# Patient Record
Sex: Male | Born: 1985 | Race: Black or African American | Hispanic: No | Marital: Single | Smoking: Never smoker
Health system: Southern US, Community
[De-identification: ages and names within clinical notes are randomized; demographics above are authoritative.]

---

## 2012-03-23 ENCOUNTER — Emergency Department (HOSPITAL_COMMUNITY)
Admission: EM | Admit: 2012-03-23 | Discharge: 2012-03-23 | Disposition: A | Discharge status: Home / Self Care | Payer: Self-pay | Attending: Emergency Medicine | Admitting: Emergency Medicine

## 2012-03-23 ENCOUNTER — Encounter (HOSPITAL_COMMUNITY): Payer: Self-pay | Admitting: *Deleted

## 2012-03-23 DIAGNOSIS — G510 Bell's palsy: Secondary | ICD-10-CM

## 2012-03-23 DIAGNOSIS — R209 Unspecified disturbances of skin sensation: Secondary | ICD-10-CM | POA: Insufficient documentation

## 2012-03-23 DIAGNOSIS — F1210 Cannabis abuse, uncomplicated: Secondary | ICD-10-CM | POA: Insufficient documentation

## 2012-03-23 DIAGNOSIS — H538 Other visual disturbances: Secondary | ICD-10-CM | POA: Insufficient documentation

## 2012-03-23 MED ORDER — PREDNISONE 10 MG PO TABS: 60.0000 mg | ORAL_TABLET | Freq: Every day | ORAL | Status: AC

## 2012-03-23 MED ORDER — PREDNISONE 20 MG PO TABS
60.0000 mg | ORAL_TABLET | Freq: Once | ORAL | Status: AC
  Administered 2012-03-23: 60 mg via ORAL
  Filled 2012-03-23: qty 3

## 2012-03-23 MED ORDER — TEARS RENEWED OP SOLN: 1.0000 [drp] | OPHTHALMIC | Status: AC

## 2012-03-23 MED ORDER — ACYCLOVIR 400 MG PO TABS: 400.0000 mg | ORAL_TABLET | Freq: Four times a day (QID) | ORAL | Status: AC

## 2012-03-23 NOTE — ED Notes (Signed)
Pt denies any difficulty swallowing or eating.

## 2012-03-23 NOTE — ED Notes (Signed)
Pt states since last Friday has had L side facial swelling and numbness, smile unsymmetrical and tongue drifts to L side, denies pain, denies headache, denies n/v/d, states sometimes has blurred vision in R eye not L eye. Pt a/o x 4, neuro intact.

## 2012-03-23 NOTE — ED Provider Notes (Signed)
History     CSN: [id number]  Arrival date & time 03/23/12  0718   First MD Initiated Contact with Patient 03/23/12 [phone]      Chief Complaint  Patient presents with  . Facial Swelling  . facial numbness      The history is provided by the patient.   patient reports 6 days of right-sided facial weakness.  He reports his smile does not work on the right side of his face.  Also had significant tearing of the right side of his eye.  He denies dental pain.  No weakness of his arms or legs.  No fevers or chills.  No chest pain shortness of breath.  No other complaints.  Gait normal.  Symptoms have been persistent for 6 days and they're not worsening nor improving.  History reviewed. No pertinent past medical history.  History reviewed. No pertinent past surgical history.  History reviewed. No pertinent family history.  History  Substance Use Topics  . Smoking status: Never Smoker   . Smokeless tobacco: Never Used  . Alcohol Use: Yes      Review of Systems  All other systems reviewed and are negative.    Allergies  Review of patient's allergies indicates no known allergies.  Home Medications   Current Outpatient Rx  Name  Route  Sig  Dispense  Refill  . diphenhydrAMINE (BENADRYL) 25 mg capsule   Oral   Take 25 mg by mouth every 6 (six) hours as needed for itching.         Marland Kitchen acyclovir (ZOVIRAX) 400 MG tablet   Oral   Take 1 tablet (400 mg total) by mouth 4 (four) times daily.   28 tablet   0   . dextran 70-hypromellose (TEARS RENEWED) ophthalmic solution   Right Eye   Place 1 drop into the right eye every hour while awake.   15 mL   12   . predniSONE (DELTASONE) 10 MG tablet   Oral   Take 6 tablets (60 mg total) by mouth daily.   30 tablet   0     BP 128/76  Pulse 79  Temp(Src) 97.9 F (36.6 C) (Oral)  Resp 18  SpO2 99%  Physical Exam  Nursing note and vitals reviewed. Constitutional: He is oriented to person, place, and time. He appears  well-developed and well-nourished.  HENT:  Head: Normocephalic and atraumatic.  Pt with right sided facial droop and tearing of right eye. Weakness of right forehead and abnormal facial motor strength on right side of face as compared to left  Eyes: EOM are normal.  Neck: Normal range of motion.  Cardiovascular: Normal rate, regular rhythm, normal heart sounds and intact distal pulses.   Pulmonary/Chest: Effort normal and breath sounds normal. No respiratory distress.  Abdominal: Soft. He exhibits no distension. There is no tenderness.  Musculoskeletal: Normal range of motion.  Neurological: He is alert and oriented to person, place, and time.  Skin: Skin is warm and dry.  Psychiatric: He has a normal mood and affect. Judgment normal.    ED Course  Procedures (including critical care time)  Labs Reviewed - No data to display No results found.   1. Bell's palsy       MDM  Bells palsy. Weakness of right face and right forehead. Artificial tears, steroids, antivirals and neuro follow up        Lyanne Co, MD 03/23/12 [phone]

## 2016-07-20 ENCOUNTER — Emergency Department (HOSPITAL_COMMUNITY)
Admission: EM | Admit: 2016-07-20 | Discharge: 2016-07-20 | Disposition: A | Discharge status: Home / Self Care | Payer: No Typology Code available for payment source | Attending: Emergency Medicine | Admitting: Emergency Medicine

## 2016-07-20 ENCOUNTER — Emergency Department (HOSPITAL_COMMUNITY): Payer: No Typology Code available for payment source

## 2016-07-20 ENCOUNTER — Encounter (HOSPITAL_COMMUNITY): Payer: Self-pay | Admitting: *Deleted

## 2016-07-20 DIAGNOSIS — S62613A Displaced fracture of proximal phalanx of left middle finger, initial encounter for closed fracture: Secondary | ICD-10-CM | POA: Diagnosis not present

## 2016-07-20 DIAGNOSIS — Y92410 Unspecified street and highway as the place of occurrence of the external cause: Secondary | ICD-10-CM | POA: Diagnosis not present

## 2016-07-20 DIAGNOSIS — Z23 Encounter for immunization: Secondary | ICD-10-CM | POA: Insufficient documentation

## 2016-07-20 DIAGNOSIS — Z79899 Other long term (current) drug therapy: Secondary | ICD-10-CM | POA: Insufficient documentation

## 2016-07-20 DIAGNOSIS — Y939 Activity, unspecified: Secondary | ICD-10-CM | POA: Insufficient documentation

## 2016-07-20 DIAGNOSIS — S8012XA Contusion of left lower leg, initial encounter: Secondary | ICD-10-CM

## 2016-07-20 DIAGNOSIS — S8011XA Contusion of right lower leg, initial encounter: Secondary | ICD-10-CM

## 2016-07-20 DIAGNOSIS — S6992XA Unspecified injury of left wrist, hand and finger(s), initial encounter: Secondary | ICD-10-CM | POA: Diagnosis present

## 2016-07-20 DIAGNOSIS — Y998 Other external cause status: Secondary | ICD-10-CM | POA: Insufficient documentation

## 2016-07-20 MED ORDER — TETANUS-DIPHTH-ACELL PERTUSSIS 5-2.5-18.5 LF-MCG/0.5 IM SUSP
0.5000 mL | Freq: Once | INTRAMUSCULAR | Status: AC
  Administered 2016-07-20: 0.5 mL via INTRAMUSCULAR
  Filled 2016-07-20: qty 0.5
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
## 2016-07-20 NOTE — Discharge Instructions (Signed)
Please read instructions below. Apply ice to your finger for 20 minutes at a time. You can take advil every 6 hours as needed for pain. Schedule an appointment with the hand specialist for follow-up on your injury. Return to the ER for new or concerning symptoms.
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
## 2016-07-20 NOTE — ED Notes (Signed)
On way to XR 
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
